# Patient Record
Sex: Male | Born: 1965 | Race: White | Hispanic: No | Marital: Married | Smoking: Never smoker
Health system: Southern US, Community
[De-identification: ages and names within clinical notes are randomized; demographics above are authoritative.]

## PROBLEM LIST (undated history)

## (undated) DIAGNOSIS — K219 Gastro-esophageal reflux disease without esophagitis: Secondary | ICD-10-CM

---

## 2017-09-17 ENCOUNTER — Emergency Department (HOSPITAL_COMMUNITY): Payer: 59

## 2017-09-17 ENCOUNTER — Other Ambulatory Visit: Payer: Self-pay

## 2017-09-17 ENCOUNTER — Encounter (HOSPITAL_COMMUNITY): Payer: Self-pay

## 2017-09-17 DIAGNOSIS — R002 Palpitations: Secondary | ICD-10-CM | POA: Diagnosis not present

## 2017-09-17 DIAGNOSIS — R0789 Other chest pain: Secondary | ICD-10-CM | POA: Insufficient documentation

## 2017-09-17 LAB — BASIC METABOLIC PANEL
Anion gap: 10 (ref 5–15)
BUN: 9 mg/dL (ref 6–20)
CHLORIDE: 102 mmol/L (ref 101–111)
CO2: 24 mmol/L (ref 22–32)
Calcium: 9 mg/dL (ref 8.9–10.3)
Creatinine, Ser: 1.3 mg/dL — ABNORMAL HIGH (ref 0.61–1.24)
GFR calc Af Amer: >60 mL/min (ref >60)
GFR calc non Af Amer: >60 mL/min (ref >60)
Glucose, Bld: 76 mg/dL (ref 65–99)
Potassium: 3.5 mmol/L (ref 3.5–5.1)
Sodium: 136 mmol/L (ref 135–145)

## 2017-09-17 LAB — CBC
HEMATOCRIT: 48 % (ref 39.0–52.0)
Hemoglobin: 17.2 g/dL — ABNORMAL HIGH (ref 13.0–17.0)
MCH: 31.7 pg (ref 26.0–34.0)
MCHC: 35.8 g/dL (ref 30.0–36.0)
MCV: 88.6 fL (ref 78.0–100.0)
PLATELETS: 165 10*3/uL (ref 150–400)
RBC: 5.42 MIL/uL (ref 4.22–5.81)
RDW: 13.1 % (ref 11.5–15.5)
WBC: 9.7 10*3/uL (ref 4.0–10.5)

## 2017-09-17 LAB — I-STAT TROPONIN, ED: Troponin i, poc: 0 ng/mL (ref 0.00–0.08)

## 2017-09-17 NOTE — ED Triage Notes (Signed)
Pt states that he began to have central CP around 4 pm with dizziness, denies SOB/n/v, pt states he feels anxious

## 2017-09-18 ENCOUNTER — Emergency Department (HOSPITAL_COMMUNITY)
Admission: EM | Admit: 2017-09-18 | Discharge: 2017-09-18 | Disposition: A | Discharge status: Home / Self Care | Payer: 59 | Attending: Emergency Medicine | Admitting: Emergency Medicine

## 2017-09-18 DIAGNOSIS — R079 Chest pain, unspecified: Secondary | ICD-10-CM

## 2017-09-18 DIAGNOSIS — R002 Palpitations: Secondary | ICD-10-CM

## 2017-09-18 HISTORY — DX: Gastro-esophageal reflux disease without esophagitis: K21.9

## 2017-09-18 LAB — I-STAT TROPONIN, ED: Troponin i, poc: 0 ng/mL (ref 0.00–0.08)

## 2017-09-18 NOTE — ED Provider Notes (Signed)
TIME SEEN: 5:06 AM  CHIEF COMPLAINT: Palpitations, chest pain  HPI: Patient is a 52 year old male with history of GERD who presents to the emergency department with palpitations that started at 4 PM yesterday afternoon.  Describes feeling an anterior chest pressure with these palpitations but no shortness of breath, diaphoresis or dizziness.  Did have some nausea and vomiting before this episode.  Now completely asymptomatic.  Denies history of hypertension, diabetes.  Does state that he has hyperlipidemia and takes a statin.  No history of smoking but does chew tobacco.  States he does fly frequently but never longer than 3-4 hours at a time.  No lower extremity swelling or pain.  No other prolonged immobilization such as long travel, hospitalization, fracture, surgery, trauma.  No recent fever, cough, diarrhea.   ROS: See HPI Constitutional: no fever  Eyes: no drainage  ENT: no runny nose   Cardiovascular:  chest pain  Resp: no SOB  GI: no vomiting GU: no dysuria Integumentary: no rash  Allergy: no hives  Musculoskeletal: no leg swelling  Neurological: no slurred speech ROS otherwise negative  PAST MEDICAL HISTORY/PAST SURGICAL HISTORY:  Past Medical History:  Diagnosis Date  . GERD (gastroesophageal reflux disease)     MEDICATIONS:  Prior to Admission medications   Not on File    ALLERGIES:  No Known Allergies  SOCIAL HISTORY:  Social History   Tobacco Use  . Smoking status: Never Smoker  . Smokeless tobacco: Current User    Types: Chew  Substance Use Topics  . Alcohol use: No    Frequency: Never    FAMILY HISTORY: No family history on file.  EXAM: BP 128/78 (BP Location: Right Arm)   Pulse 71   Temp 98 F (36.7 C) (Oral)   Resp 18   Ht 5\' 10"  (1.778 m)   Wt 93 kg (205 lb)   SpO2 100%   BMI 29.41 kg/m  CONSTITUTIONAL: Alert and oriented and responds appropriately to questions. Well-appearing; well-nourished HEAD: Normocephalic EYES: Conjunctivae  clear, pupils appear equal, EOMI ENT: normal nose; moist mucous membranes NECK: Supple, no meningismus, no nuchal rigidity, no LAD  CARD: RRR; S1 and S2 appreciated; no murmurs, no clicks, no rubs, no gallops RESP: Normal chest excursion without splinting or tachypnea; breath sounds clear and equal bilaterally; no wheezes, no rhonchi, no rales, no hypoxia or respiratory distress, speaking full sentences ABD/GI: Normal bowel sounds; non-distended; soft, non-tender, no rebound, no guarding, no peritoneal signs, no hepatosplenomegaly BACK:  The back appears normal and is non-tender to palpation, there is no CVA tenderness EXT: Normal ROM in all joints; non-tender to palpation; no edema; normal capillary refill; no cyanosis, no calf tenderness or swelling    SKIN: Normal color for age and race; warm; no rash NEURO: Moves all extremities equally PSYCH: The patient's mood and manner are appropriate. Grooming and personal hygiene are appropriate.  MEDICAL DECISION MAKING: Patient here with chest pain.  Completely asymptomatic for several hours.  Has now had 2 negative troponins.  EKG shows normal sinus rhythm without ischemic abnormalities, arrhythmia or interval changes.  He has had 2 normal EKGs.  He does have risk factors for DVT and PE including multiple flights but states that they are never longer than 3-4 hours and he gets up frequently.  He denies any other recent prolonged immobilization.  Have low suspicion that this is a clinically significant PE especially given he is asymptomatic.  Patient states that he would like to be discharged as he  has a flight to catch to [place] in the next several hours.  He is comfortable with the plan for outpatient cardiology follow-up when he returns home.  I have provided him with a copy of his labs, chest x-ray and EKG today.  Have recommended close cardiology follow-up as they may recommend outpatient stress test, echocardiogram, Holter monitoring.  His heart score  is 3.  I do not feel he needs admission at this time.  Patient is comfortable with this plan.  Doubt dissection.  No sign of pneumonia, volume overload, pneumothorax.  At this time, I do not feel there is any life-threatening condition present. I have reviewed and discussed all results (EKG, imaging, lab, urine as appropriate) and exam findings with patient/family. I have reviewed nursing notes and appropriate previous records.  I feel the patient is safe to be discharged home without further emergent workup and can continue workup as an outpatient as needed. Discussed usual and customary return precautions. Patient/family verbalize understanding and are comfortable with this plan.  Outpatient follow-up has been provided if needed. All questions have been answered.      EKG Interpretation  Date/Time:  Wednesday September 17 2017 20:51:55 EST Ventricular Rate:  72 PR Interval:  136 QRS Duration: 94 QT Interval:  368 QTC Calculation: 402 R Axis:   80 Text Interpretation:  Normal sinus rhythm Possible Anterior infarct , age undetermined Abnormal ECG No old tracing to compare Confirmed by Jorge Retz, Baxter Hire [phone]) on 09/18/2017 5:06:13 AM         Vivien Barretto, Layla Maw, DO 09/18/17 [id number]

## 2017-09-18 NOTE — Discharge Instructions (Signed)
You have had normal labs today including 2 negative sets of cardiac enzymes.  Your chest x-ray was clear.  You EKG was normal.  I recommend close follow-up with a cardiologist when you return to [place].

## 2018-12-28 IMAGING — DX DG CHEST 2V
2 series · 2 of 2 positions shown · non-contrast
Comparison: None.

CLINICAL DATA: Chest pain

EXAM:
CHEST  2 VIEW

[chest pa]
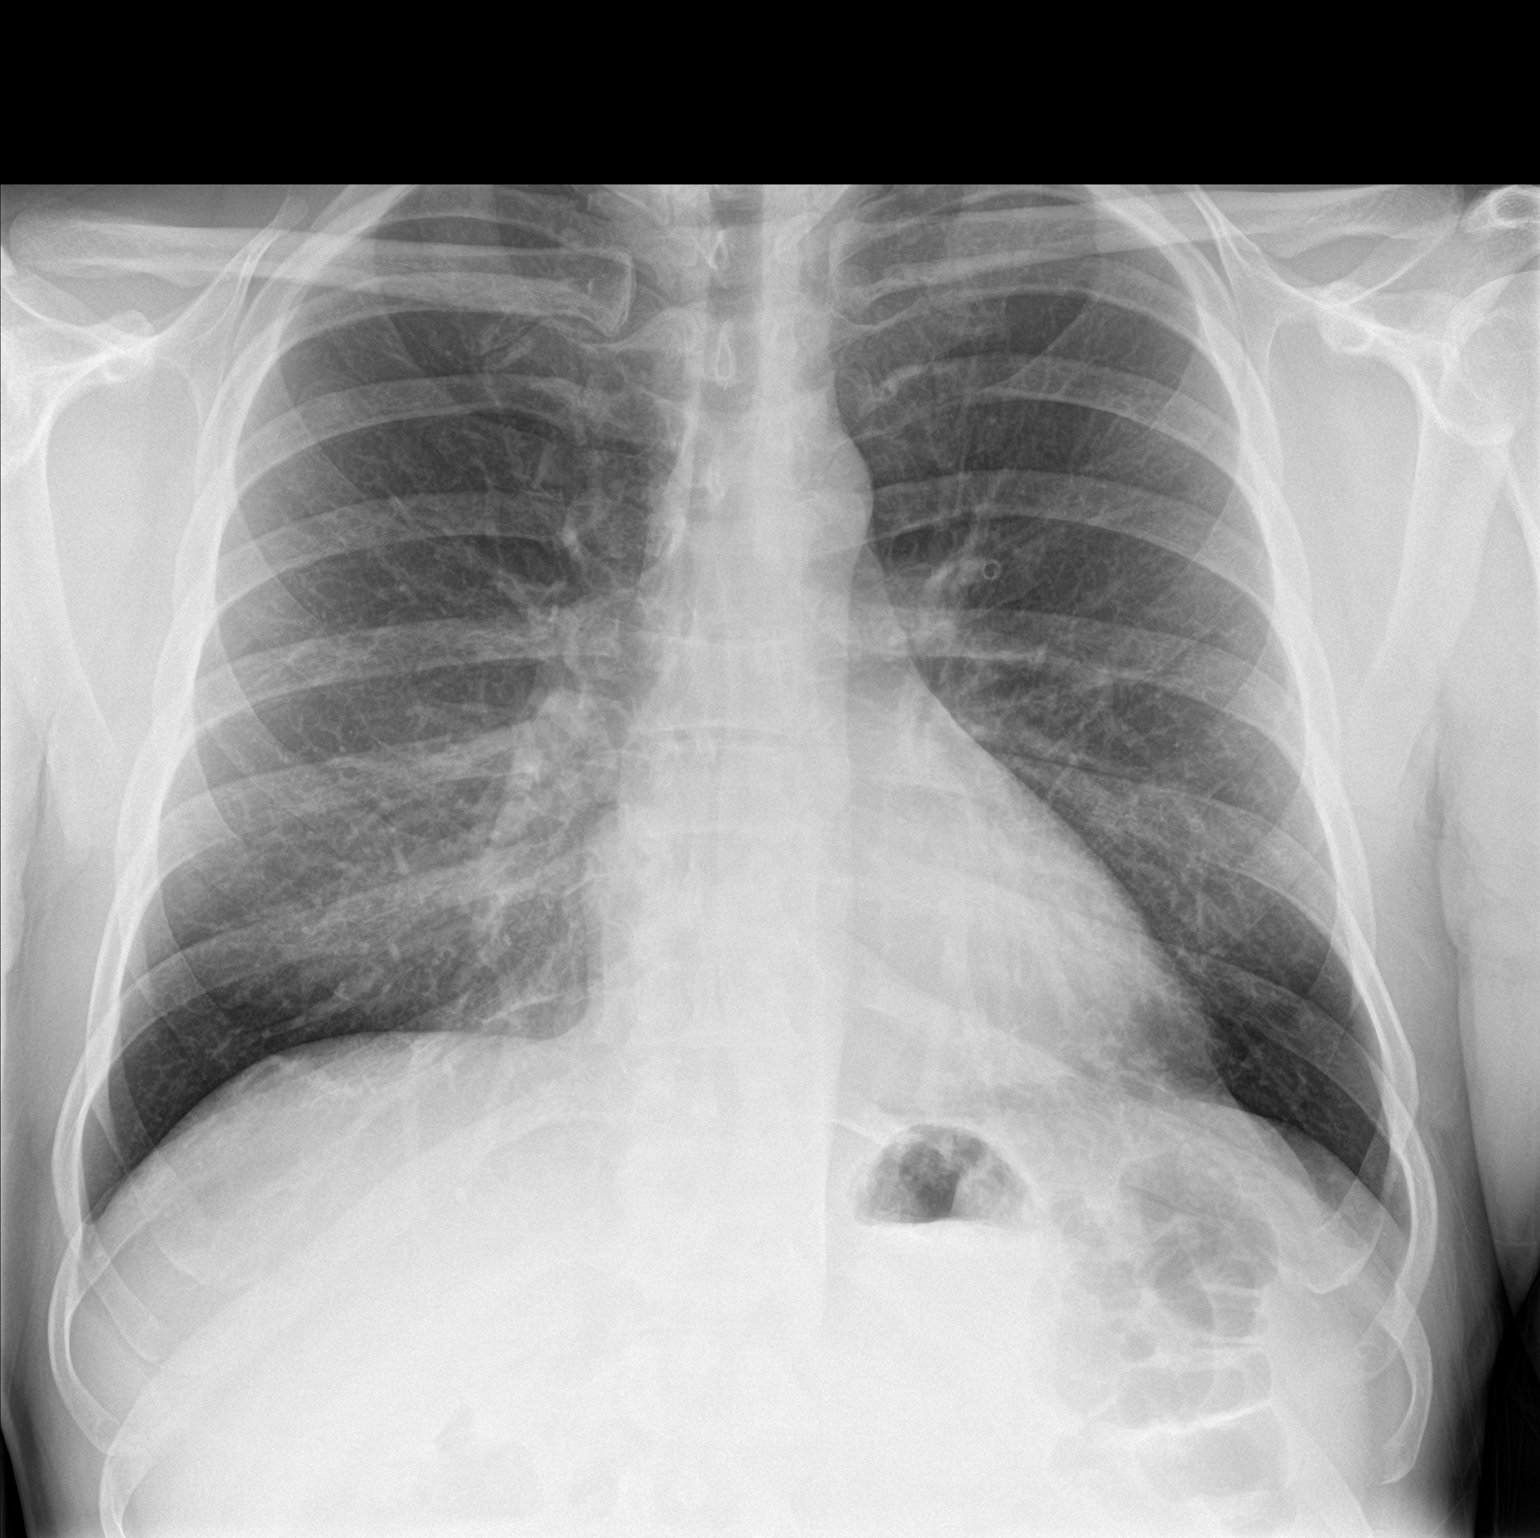

[chest lat]
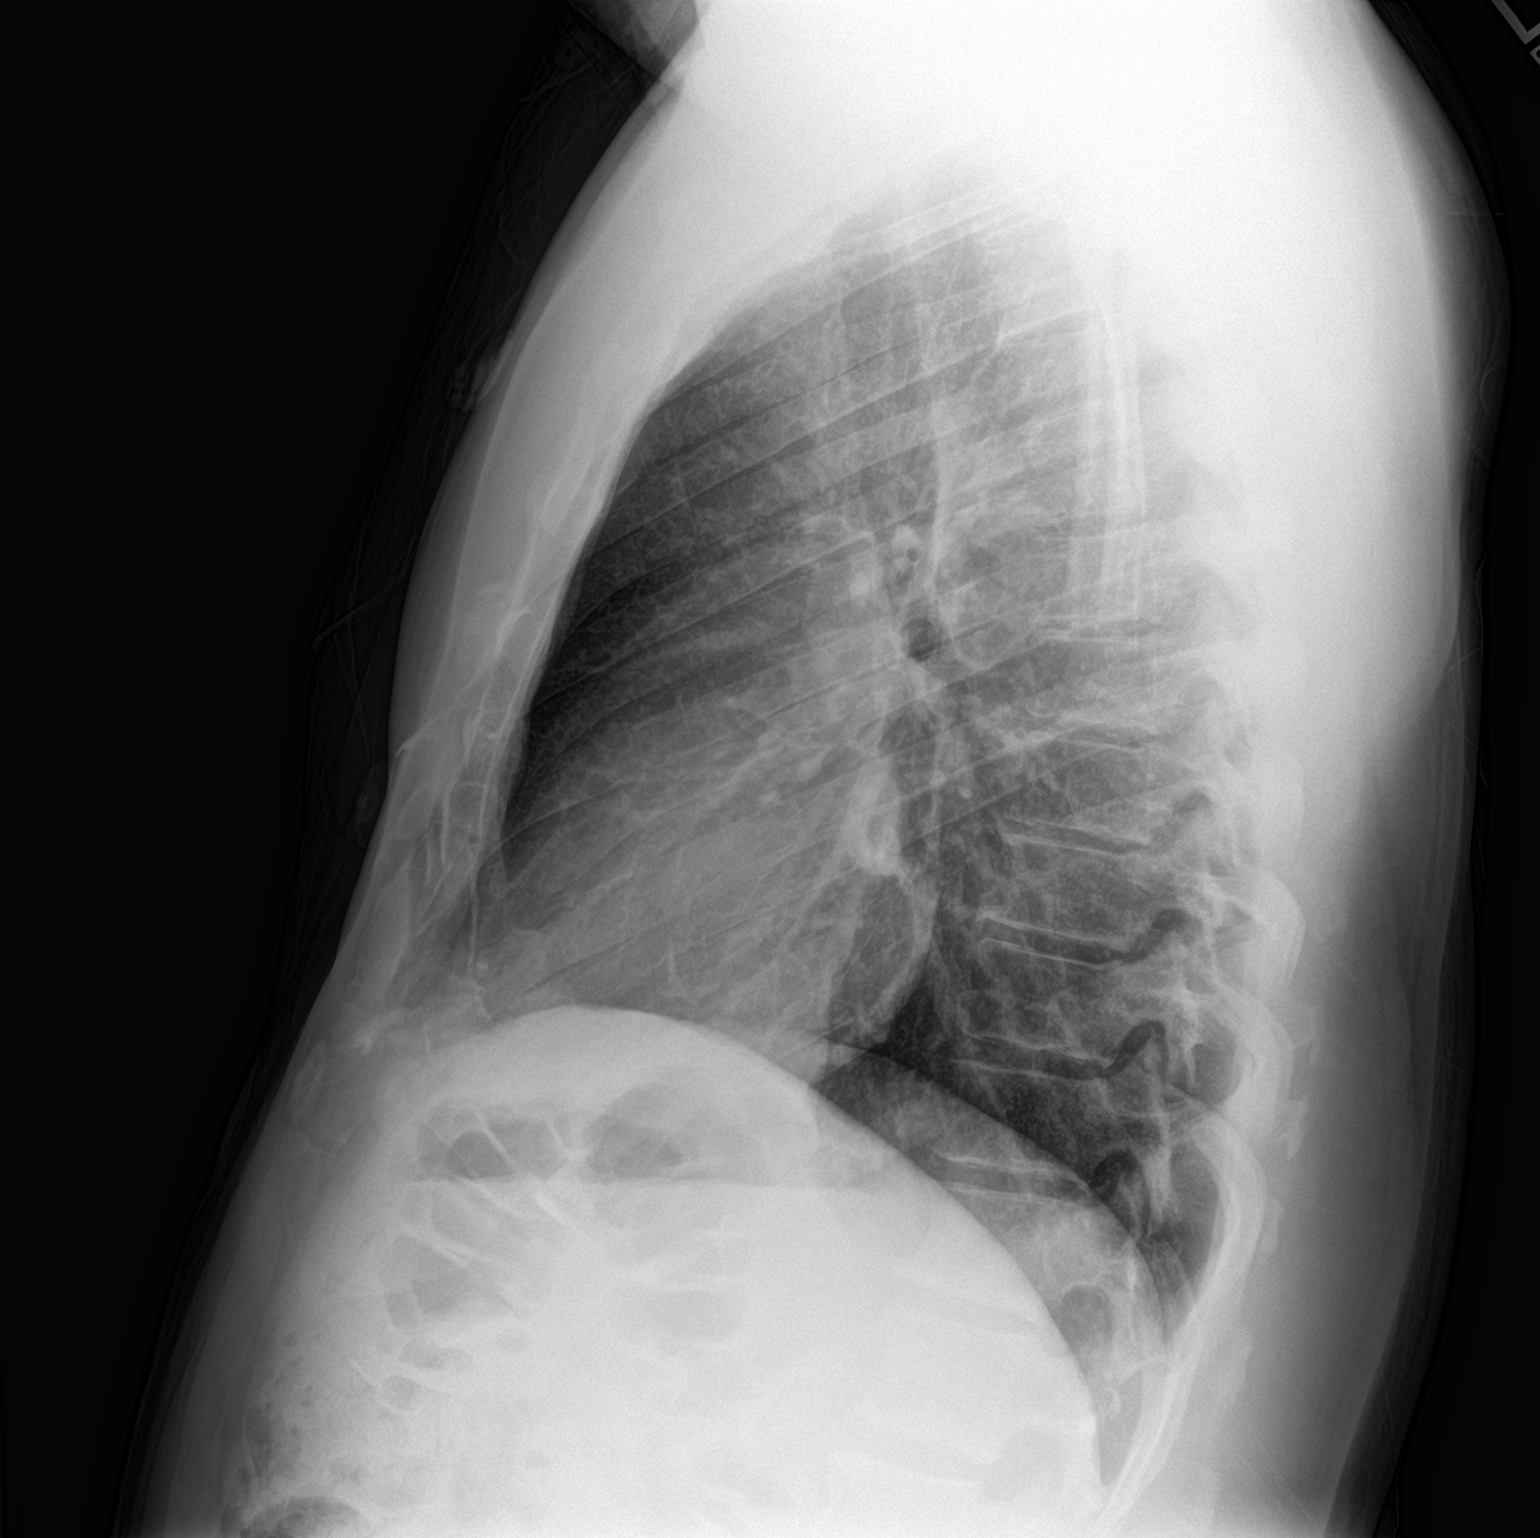

[2 of 2 positions shown; findings below may reference images not displayed]

FINDINGS: The heart size and mediastinal contours are within normal limits.
Both lungs are clear. The visualized skeletal structures are
unremarkable.
IMPRESSION: No active cardiopulmonary disease.
